# Patient Record
Sex: Male | Born: 2000 | Race: White | Hispanic: No | Marital: Single
Health system: Southern US, Community
[De-identification: ages and names within clinical notes are randomized; demographics above are authoritative.]

---
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
## 2022-12-02 ENCOUNTER — Other Ambulatory Visit: Payer: Self-pay

## 2022-12-02 MED ORDER — PREVIDENT 5000 BOOSTER PLUS 1.1 % DT PSTE
PASTE | DENTAL | 0 refills | Status: AC
  Filled 2022-12-02: qty 100, 30d supply, fill #0

## 2023-06-12 DIAGNOSIS — S0101XA Laceration without foreign body of scalp, initial encounter: Secondary | ICD-10-CM | POA: Diagnosis not present

## 2023-09-15 DIAGNOSIS — H9313 Tinnitus, bilateral: Secondary | ICD-10-CM | POA: Diagnosis not present

## 2023-09-15 DIAGNOSIS — Z7689 Persons encountering health services in other specified circumstances: Secondary | ICD-10-CM | POA: Diagnosis not present

## 2023-09-15 DIAGNOSIS — Z0000 Encounter for general adult medical examination without abnormal findings: Secondary | ICD-10-CM | POA: Diagnosis not present

## 2024-01-13 ENCOUNTER — Other Ambulatory Visit: Payer: Self-pay

## 2024-01-13 MED ORDER — SODIUM FLUORIDE 1.1 % DT PSTE
PASTE | Freq: Two times a day (BID) | DENTAL | 3 refills | Status: AC
  Filled 2024-01-13: qty 100, 30d supply, fill #0

## 2024-01-18 ENCOUNTER — Other Ambulatory Visit: Payer: Self-pay

## 2024-09-14 DIAGNOSIS — Z0000 Encounter for general adult medical examination without abnormal findings: Secondary | ICD-10-CM | POA: Diagnosis not present

## 2024-09-14 DIAGNOSIS — Z23 Encounter for immunization: Secondary | ICD-10-CM | POA: Diagnosis not present

## 2024-09-14 DIAGNOSIS — F321 Major depressive disorder, single episode, moderate: Secondary | ICD-10-CM | POA: Diagnosis not present

## 2024-09-14 DIAGNOSIS — F411 Generalized anxiety disorder: Secondary | ICD-10-CM | POA: Diagnosis not present

## 2024-09-14 DIAGNOSIS — Z1331 Encounter for screening for depression: Secondary | ICD-10-CM | POA: Diagnosis not present

## 2024-09-14 DIAGNOSIS — Z1159 Encounter for screening for other viral diseases: Secondary | ICD-10-CM | POA: Diagnosis not present

## 2024-09-18 ENCOUNTER — Other Ambulatory Visit: Payer: Self-pay

## 2024-09-18 MED ORDER — VITAMIN D (ERGOCALCIFEROL) 1.25 MG (50000 UNIT) PO CAPS
50000.0000 [IU] | ORAL_CAPSULE | ORAL | 1 refills | Status: AC
  Filled 2024-09-18: qty 12, 84d supply, fill #0
